# Patient Record
Sex: Female | Born: 2000 | Hispanic: Yes | Marital: Single | Smoking: Never smoker
Health system: Southern US, Community
[De-identification: ages and names within clinical notes are randomized; demographics above are authoritative.]

## PROBLEM LIST (undated history)

## (undated) DIAGNOSIS — Z789 Other specified health status: Secondary | ICD-10-CM

---
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
## 2021-03-30 IMAGING — DX ABDOMEN - 1 VIEW
1 series · 1 of 1 positions shown · non-contrast
Comparison: None.

CLINICAL DATA: Abdominal pain.

EXAM:
ABDOMEN - 1 VIEW

[abdomen kub]
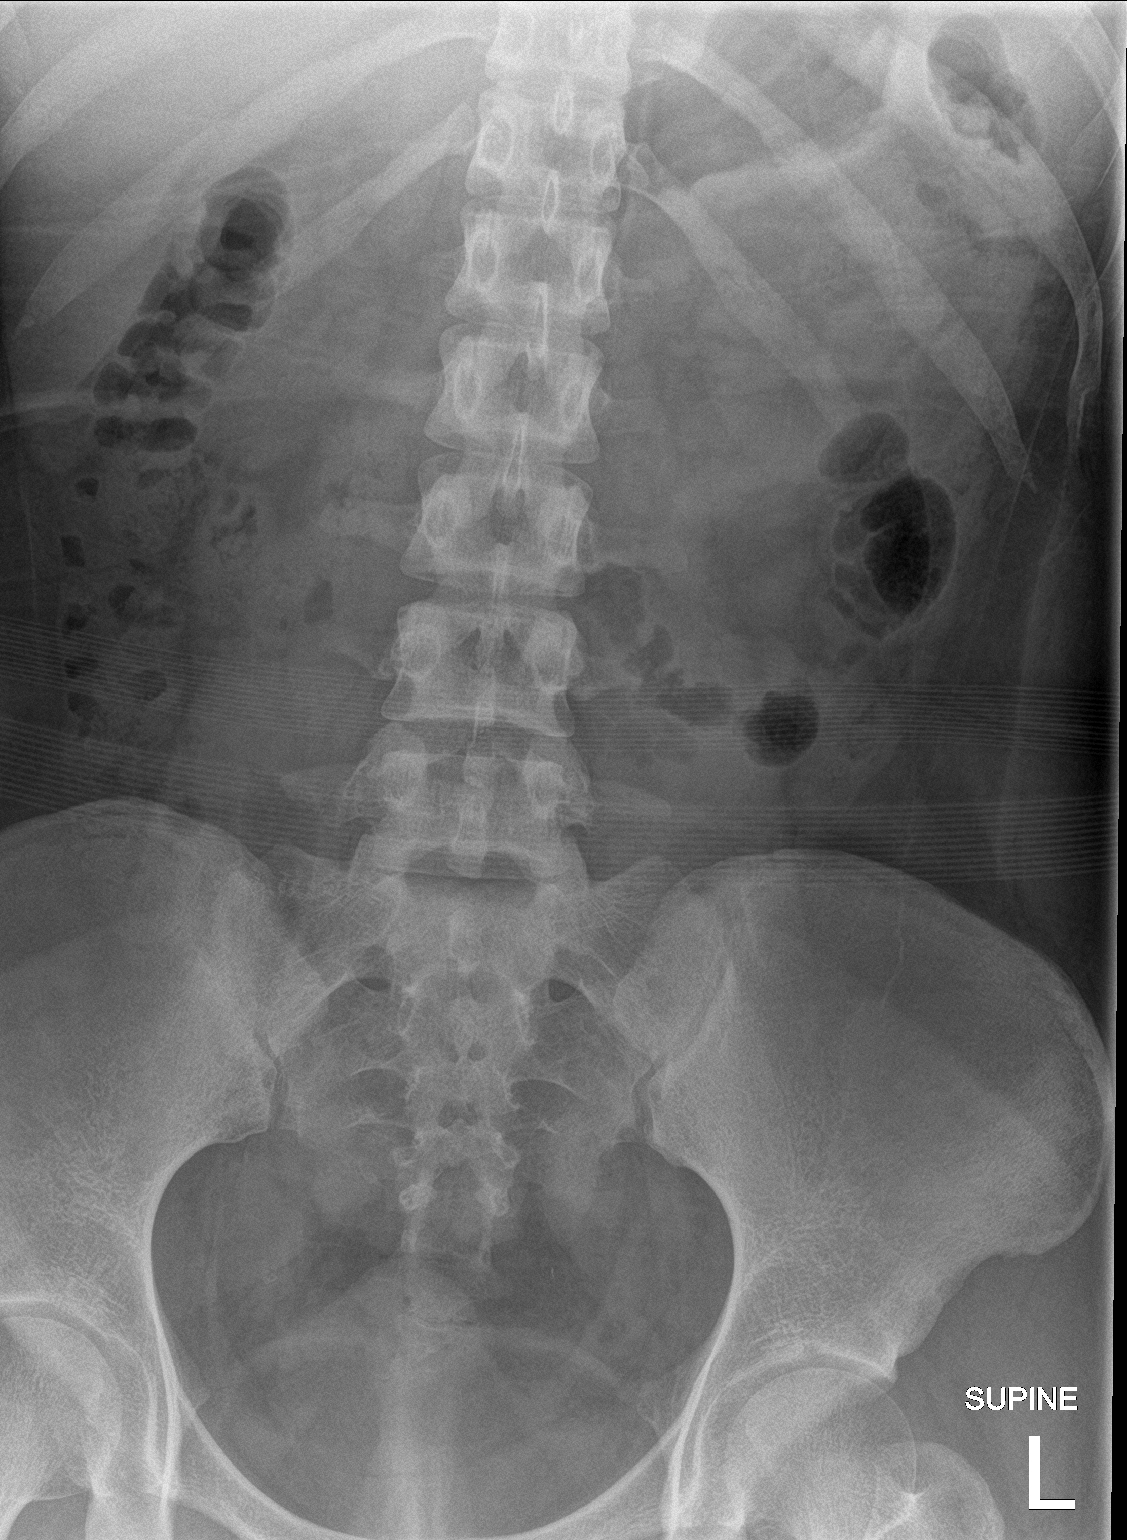

[1 of 1 positions shown; findings below may reference images not displayed]

FINDINGS: Normal bowel gas pattern. No bowel dilatation to suggest
obstruction. Small volume of colonic stool. No evidence of free air
on this single supine view. No radiopaque calculi or abnormal soft
tissue calcifications. No osseous abnormalities.
IMPRESSION: Unremarkable radiograph of the abdomen.
